# Patient Record
Sex: Male | Born: 1965 | Race: White | Hispanic: No | Marital: Married | Smoking: Former smoker
Health system: Southern US, Community
[De-identification: ages and names within clinical notes are randomized; demographics above are authoritative.]

## PROBLEM LIST (undated history)

## (undated) DIAGNOSIS — S42002A Fracture of unspecified part of left clavicle, initial encounter for closed fracture: Secondary | ICD-10-CM

## (undated) HISTORY — PX: ESOPHAGOGASTRODUODENOSCOPY: SHX1529

---

## 2014-07-17 ENCOUNTER — Encounter (HOSPITAL_COMMUNITY): Payer: Self-pay | Admitting: *Deleted

## 2014-07-17 NOTE — Progress Notes (Signed)
Pt denies SOB, chest pain, and being under the care of a cardiologist. Pt denies having a chest x ray and EKG within the last year. Pt denies having a stress test, echo and cardiac cath. Pt made aware to stop  taking Aspirin, otc vitamins and herbal medications. Do not take any NSAIDs ie: Ibuprofen, Advil, Naproxen or any medication containing Aspirin. Pt verbalized understanding of all pre-op instructions. 

## 2014-07-18 ENCOUNTER — Encounter (HOSPITAL_COMMUNITY): Payer: Self-pay | Admitting: Surgery

## 2014-07-18 ENCOUNTER — Ambulatory Visit (HOSPITAL_COMMUNITY): Payer: BLUE CROSS/BLUE SHIELD | Admitting: Certified Registered Nurse Anesthetist

## 2014-07-18 ENCOUNTER — Ambulatory Visit (HOSPITAL_COMMUNITY)
Admission: RE | Admit: 2014-07-18 | Discharge: 2014-07-18 | Disposition: A | Discharge status: Home / Self Care | Payer: BLUE CROSS/BLUE SHIELD | Source: Ambulatory Visit | Attending: Orthopedic Surgery | Admitting: Orthopedic Surgery

## 2014-07-18 ENCOUNTER — Ambulatory Visit (HOSPITAL_COMMUNITY): Payer: BLUE CROSS/BLUE SHIELD

## 2014-07-18 ENCOUNTER — Encounter (HOSPITAL_COMMUNITY)
Admission: RE | Disposition: A | Discharge status: Home / Self Care | Payer: Self-pay | Source: Ambulatory Visit | Attending: Orthopedic Surgery

## 2014-07-18 DIAGNOSIS — Z87891 Personal history of nicotine dependence: Secondary | ICD-10-CM | POA: Insufficient documentation

## 2014-07-18 DIAGNOSIS — X58XXXA Exposure to other specified factors, initial encounter: Secondary | ICD-10-CM | POA: Diagnosis not present

## 2014-07-18 DIAGNOSIS — Y9355 Activity, bike riding: Secondary | ICD-10-CM | POA: Insufficient documentation

## 2014-07-18 DIAGNOSIS — S42022A Displaced fracture of shaft of left clavicle, initial encounter for closed fracture: Secondary | ICD-10-CM | POA: Insufficient documentation

## 2014-07-18 DIAGNOSIS — Z419 Encounter for procedure for purposes other than remedying health state, unspecified: Secondary | ICD-10-CM

## 2014-07-18 HISTORY — DX: Fracture of unspecified part of left clavicle, initial encounter for closed fracture: S42.002A

## 2014-07-18 HISTORY — PX: ORIF CLAVICULAR FRACTURE: SHX5055

## 2014-07-18 LAB — CBC WITH DIFFERENTIAL/PLATELET
Basophils Absolute: 0 10*3/uL (ref 0.0–0.1)
Basophils Relative: 0 % (ref 0–1)
EOS PCT: 2 % (ref 0–5)
Eosinophils Absolute: 0.1 10*3/uL (ref 0.0–0.7)
HCT: 37.1 % — ABNORMAL LOW (ref 39.0–52.0)
HEMOGLOBIN: 12.9 g/dL — AB (ref 13.0–17.0)
Lymphocytes Relative: 23 % (ref 12–46)
Lymphs Abs: 1.6 10*3/uL (ref 0.7–4.0)
MCH: 31.8 pg (ref 26.0–34.0)
MCHC: 34.8 g/dL (ref 30.0–36.0)
MCV: 91.4 fL (ref 78.0–100.0)
MONO ABS: 0.6 10*3/uL (ref 0.1–1.0)
MONOS PCT: 9 % (ref 3–12)
NEUTROS ABS: 4.6 10*3/uL (ref 1.7–7.7)
Neutrophils Relative %: 66 % (ref 43–77)
PLATELETS: 224 10*3/uL (ref 150–400)
RBC: 4.06 MIL/uL — ABNORMAL LOW (ref 4.22–5.81)
RDW: 12.1 % (ref 11.5–15.5)
WBC: 7 10*3/uL (ref 4.0–10.5)

## 2014-07-18 LAB — COMPREHENSIVE METABOLIC PANEL
ALK PHOS: 52 U/L (ref 38–126)
ALT: 19 U/L (ref 17–63)
AST: 33 U/L (ref 15–41)
Albumin: 3.9 g/dL (ref 3.5–5.0)
Anion gap: 8 (ref 5–15)
BUN: 16 mg/dL (ref 6–20)
CALCIUM: 9.4 mg/dL (ref 8.9–10.3)
CO2: 27 mmol/L (ref 22–32)
Chloride: 104 mmol/L (ref 101–111)
Creatinine, Ser: 0.76 mg/dL (ref 0.61–1.24)
GLUCOSE: 118 mg/dL — AB (ref 65–99)
POTASSIUM: 4.5 mmol/L (ref 3.5–5.1)
SODIUM: 139 mmol/L (ref 135–145)
Total Bilirubin: 1.6 mg/dL — ABNORMAL HIGH (ref 0.3–1.2)
Total Protein: 6.6 g/dL (ref 6.5–8.1)

## 2014-07-18 LAB — APTT: aPTT: 29 seconds (ref 24–37)

## 2014-07-18 LAB — PROTIME-INR
INR: 1.05 (ref 0.00–1.49)
Prothrombin Time: 13.9 seconds (ref 11.6–15.2)

## 2014-07-18 SURGERY — OPEN REDUCTION INTERNAL FIXATION (ORIF) CLAVICULAR FRACTURE
Anesthesia: Regional | Site: Shoulder | Laterality: Left

## 2014-07-18 MED ORDER — GLYCOPYRROLATE 0.2 MG/ML IJ SOLN
INTRAMUSCULAR | Status: AC
  Filled 2014-07-18: qty 2

## 2014-07-18 MED ORDER — CHLORHEXIDINE GLUCONATE 4 % EX LIQD: 60.0000 mL | Freq: Once | CUTANEOUS | Status: DC

## 2014-07-18 MED ORDER — MIDAZOLAM HCL 5 MG/5ML IJ SOLN
INTRAMUSCULAR | Status: DC | PRN
  Administered 2014-07-18: 2 mg via INTRAVENOUS

## 2014-07-18 MED ORDER — PROPOFOL 10 MG/ML IV BOLUS
INTRAVENOUS | Status: DC | PRN
  Administered 2014-07-18: 200 mg via INTRAVENOUS

## 2014-07-18 MED ORDER — CEFAZOLIN SODIUM-DEXTROSE 2-3 GM-% IV SOLR
INTRAVENOUS | Status: AC
  Filled 2014-07-18: qty 50

## 2014-07-18 MED ORDER — FENTANYL CITRATE (PF) 100 MCG/2ML IJ SOLN
INTRAMUSCULAR | Status: DC | PRN
  Administered 2014-07-18: 150 ug via INTRAVENOUS

## 2014-07-18 MED ORDER — 0.9 % SODIUM CHLORIDE (POUR BTL) OPTIME
TOPICAL | Status: DC | PRN
  Administered 2014-07-18: 1000 mL

## 2014-07-18 MED ORDER — MIDAZOLAM HCL 2 MG/2ML IJ SOLN
INTRAMUSCULAR | Status: AC
  Administered 2014-07-18: 2 mg
  Filled 2014-07-18: qty 2

## 2014-07-18 MED ORDER — MIDAZOLAM HCL 2 MG/2ML IJ SOLN
INTRAMUSCULAR | Status: AC
  Filled 2014-07-18: qty 2

## 2014-07-18 MED ORDER — ONDANSETRON HCL 4 MG/2ML IJ SOLN
INTRAMUSCULAR | Status: DC | PRN
  Administered 2014-07-18: 4 mg via INTRAVENOUS

## 2014-07-18 MED ORDER — LACTATED RINGERS IV SOLN: INTRAVENOUS | Status: DC

## 2014-07-18 MED ORDER — GLYCOPYRROLATE 0.2 MG/ML IJ SOLN
INTRAMUSCULAR | Status: DC | PRN
  Administered 2014-07-18: 0.4 mg via INTRAVENOUS

## 2014-07-18 MED ORDER — OXYCODONE-ACETAMINOPHEN 5-325 MG PO TABS: 1.0000 | ORAL_TABLET | ORAL | Status: DC | PRN

## 2014-07-18 MED ORDER — ROCURONIUM BROMIDE 100 MG/10ML IV SOLN
INTRAVENOUS | Status: DC | PRN
  Administered 2014-07-18: 40 mg via INTRAVENOUS

## 2014-07-18 MED ORDER — FENTANYL CITRATE (PF) 250 MCG/5ML IJ SOLN
INTRAMUSCULAR | Status: AC
  Filled 2014-07-18: qty 5

## 2014-07-18 MED ORDER — ROCURONIUM BROMIDE 50 MG/5ML IV SOLN
INTRAVENOUS | Status: AC
  Filled 2014-07-18: qty 1

## 2014-07-18 MED ORDER — LACTATED RINGERS IV SOLN
INTRAVENOUS | Status: DC
  Administered 2014-07-18 (×2): via INTRAVENOUS

## 2014-07-18 MED ORDER — PROMETHAZINE HCL 25 MG/ML IJ SOLN
6.2500 mg | INTRAMUSCULAR | Status: DC | PRN
Start: 2014-07-18 — End: 2014-07-18

## 2014-07-18 MED ORDER — NEOSTIGMINE METHYLSULFATE 10 MG/10ML IV SOLN
INTRAVENOUS | Status: DC | PRN
  Administered 2014-07-18: 3 mg via INTRAVENOUS

## 2014-07-18 MED ORDER — LIDOCAINE HCL (CARDIAC) 20 MG/ML IV SOLN
INTRAVENOUS | Status: DC | PRN
  Administered 2014-07-18: 60 mg via INTRAVENOUS

## 2014-07-18 MED ORDER — SUCCINYLCHOLINE CHLORIDE 20 MG/ML IJ SOLN
INTRAMUSCULAR | Status: AC
  Filled 2014-07-18: qty 1

## 2014-07-18 MED ORDER — PROPOFOL 10 MG/ML IV BOLUS
INTRAVENOUS | Status: AC
  Filled 2014-07-18: qty 20

## 2014-07-18 MED ORDER — DIAZEPAM 5 MG PO TABS: 2.5000 mg | ORAL_TABLET | Freq: Four times a day (QID) | ORAL | Status: DC | PRN

## 2014-07-18 MED ORDER — NEOSTIGMINE METHYLSULFATE 10 MG/10ML IV SOLN
INTRAVENOUS | Status: AC
  Filled 2014-07-18: qty 1

## 2014-07-18 MED ORDER — CEFAZOLIN SODIUM-DEXTROSE 2-3 GM-% IV SOLR
2.0000 g | INTRAVENOUS | Status: AC
  Administered 2014-07-18: 2 g via INTRAVENOUS

## 2014-07-18 MED ORDER — ONDANSETRON HCL 4 MG/2ML IJ SOLN
INTRAMUSCULAR | Status: AC
  Filled 2014-07-18: qty 2

## 2014-07-18 MED ORDER — ARTIFICIAL TEARS OP OINT
TOPICAL_OINTMENT | OPHTHALMIC | Status: AC
  Filled 2014-07-18: qty 3.5

## 2014-07-18 MED ORDER — HYDROMORPHONE HCL 1 MG/ML IJ SOLN: 0.2500 mg | INTRAMUSCULAR | Status: DC | PRN

## 2014-07-18 MED ORDER — FENTANYL CITRATE (PF) 100 MCG/2ML IJ SOLN
INTRAMUSCULAR | Status: AC
  Administered 2014-07-18: 50 ug
  Filled 2014-07-18: qty 2

## 2014-07-18 SURGICAL SUPPLY — 62 items
BIT DRILL 2.3 QUICK RELEASE (BIT) ×1 IMPLANT
BIT DRILL QUICK RELEASE 2.0MM (INSTRUMENTS) ×1 IMPLANT
CLOSURE WOUND 1/2 X4 (GAUZE/BANDAGES/DRESSINGS) ×1
COVER SURGICAL LIGHT HANDLE (MISCELLANEOUS) ×3 IMPLANT
DERMABOND ADVANCED (GAUZE/BANDAGES/DRESSINGS) ×2
DERMABOND ADVANCED .7 DNX12 (GAUZE/BANDAGES/DRESSINGS) ×1 IMPLANT
DRAPE C-ARM 42X72 X-RAY (DRAPES) ×3 IMPLANT
DRAPE IMP U-DRAPE 54X76 (DRAPES) ×3 IMPLANT
DRAPE INCISE IOBAN 66X45 STRL (DRAPES) ×3 IMPLANT
DRAPE ORTHO SPLIT 77X108 STRL (DRAPES) ×2
DRAPE SURG 17X23 STRL (DRAPES) ×3 IMPLANT
DRAPE SURG ORHT 6 SPLT 77X108 (DRAPES) ×1 IMPLANT
DRAPE U-SHAPE 47X51 STRL (DRAPES) ×6 IMPLANT
DRILL 2.3 QUICK RELEASE (BIT) ×3
DRILL QUICK RELEASE 2.0MM (INSTRUMENTS) ×3
DRSG EMULSION OIL 3X3 NADH (GAUZE/BANDAGES/DRESSINGS) ×3 IMPLANT
DRSG MEPILEX BORDER 4X8 (GAUZE/BANDAGES/DRESSINGS) ×3 IMPLANT
ELECT REM PT RETURN 9FT ADLT (ELECTROSURGICAL) ×3
ELECTRODE REM PT RTRN 9FT ADLT (ELECTROSURGICAL) ×1 IMPLANT
GAUZE SPONGE 4X4 12PLY STRL (GAUZE/BANDAGES/DRESSINGS) IMPLANT
GLOVE BIO SURGEON STRL SZ7.5 (GLOVE) ×3 IMPLANT
GLOVE BIO SURGEON STRL SZ8 (GLOVE) ×3 IMPLANT
GLOVE EUDERMIC 7 POWDERFREE (GLOVE) ×3 IMPLANT
GLOVE SS BIOGEL STRL SZ 7.5 (GLOVE) ×1 IMPLANT
GLOVE SUPERSENSE BIOGEL SZ 7.5 (GLOVE) ×2
GOWN STRL REUS W/ TWL LRG LVL3 (GOWN DISPOSABLE) ×1 IMPLANT
GOWN STRL REUS W/ TWL XL LVL3 (GOWN DISPOSABLE) ×2 IMPLANT
GOWN STRL REUS W/TWL LRG LVL3 (GOWN DISPOSABLE) ×2
GOWN STRL REUS W/TWL XL LVL3 (GOWN DISPOSABLE) ×4
KIT BASIN OR (CUSTOM PROCEDURE TRAY) ×3 IMPLANT
KIT ROOM TURNOVER OR (KITS) ×3 IMPLANT
MANIFOLD NEPTUNE II (INSTRUMENTS) ×3 IMPLANT
NEEDLE HYPO 25GX1X1/2 BEV (NEEDLE) ×3 IMPLANT
NS IRRIG 1000ML POUR BTL (IV SOLUTION) ×3 IMPLANT
PACK SHOULDER (CUSTOM PROCEDURE TRAY) ×3 IMPLANT
PACK UNIVERSAL I (CUSTOM PROCEDURE TRAY) ×3 IMPLANT
PAD ARMBOARD 7.5X6 YLW CONV (MISCELLANEOUS) ×6 IMPLANT
PLATE LOCKING 8H LEFT (Plate) ×3 IMPLANT
SCREW CORTICAL 2.3X14 (Screw) ×3 IMPLANT
SCREW HEXALOBE LOCKING 3.5X16M (Screw) ×3 IMPLANT
SCREW HEXALOBE NON-LOCK 3.5X14 (Screw) ×6 IMPLANT
SCREW LOCK 12X3.5X HEXALOBE (Screw) ×1 IMPLANT
SCREW LOCKING 3.5X12 (Screw) ×2 IMPLANT
SCREW NON TOGG 2.3X20MM (Screw) ×3 IMPLANT
SCREW NONLOCK HEX 3.5X12 (Screw) ×6 IMPLANT
SLING ARM FOAM STRAP LRG (SOFTGOODS) ×3 IMPLANT
SPONGE LAP 18X18 X RAY DECT (DISPOSABLE) ×6 IMPLANT
SPONGE LAP 4X18 X RAY DECT (DISPOSABLE) ×6 IMPLANT
STAPLER VISISTAT 35W (STAPLE) ×3 IMPLANT
STRIP CLOSURE SKIN 1/2X4 (GAUZE/BANDAGES/DRESSINGS) ×2 IMPLANT
SUCTION FRAZIER TIP 10 FR DISP (SUCTIONS) ×3 IMPLANT
SUT MNCRL AB 3-0 PS2 18 (SUTURE) ×3 IMPLANT
SUT VIC AB 1 CT1 27 (SUTURE) ×4
SUT VIC AB 1 CT1 27XBRD ANBCTR (SUTURE) ×2 IMPLANT
SUT VIC AB 2-0 CT1 27 (SUTURE) ×2
SUT VIC AB 2-0 CT1 TAPERPNT 27 (SUTURE) ×1 IMPLANT
SUT VICRYL 0 CT 1 36IN (SUTURE) ×3 IMPLANT
SYR CONTROL 10ML LL (SYRINGE) ×3 IMPLANT
TOWEL OR 17X24 6PK STRL BLUE (TOWEL DISPOSABLE) ×3 IMPLANT
TOWEL OR 17X26 10 PK STRL BLUE (TOWEL DISPOSABLE) ×3 IMPLANT
WATER STERILE IRR 1000ML POUR (IV SOLUTION) IMPLANT
YANKAUER SUCT BULB TIP NO VENT (SUCTIONS) ×3 IMPLANT

## 2014-07-18 NOTE — Anesthesia Procedure Notes (Addendum)
Procedure Name: Intubation Date/Time: 07/18/2014 2:23 PM Performed by: [place] Pink Pre-anesthesia Checklist: Patient identified, Emergency Drugs available, Suction available, Patient being monitored and Timeout performed Patient Re-evaluated:Patient Re-evaluated prior to inductionOxygen Delivery Method: Circle system utilized Preoxygenation: Pre-oxygenation with 100% oxygen Intubation Type: IV induction Ventilation: Mask ventilation without difficulty Laryngoscope Size: Mac and 3 Grade View: Grade I Tube type: Oral Tube size: 7.5 mm Number of attempts: 1 Airway Equipment and Method: Stylet and LTA kit utilized Placement Confirmation: ETT inserted through vocal cords under direct vision,  positive ETCO2 and breath sounds checked- equal and bilateral Secured at: 21 cm Tube secured with: Tape Dental Injury: Teeth and Oropharynx as per pre-operative assessment    Anesthesia Regional Block:  Supraclavicular block  Pre-Anesthetic Checklist: ,, timeout performed, Correct Patient, Correct Site, Correct Laterality, Correct Procedure, Correct Position, site marked, Risks and benefits discussed,  Surgical consent,  Pre-op evaluation,  At surgeon's request and post-op pain management  Laterality: Left  Prep: chloraprep       Needles:   Needle Type: Stimulator Needle - 40          Additional Needles:  Procedures: Doppler guided, ultrasound guided (picture in chart) and nerve stimulator Supraclavicular block  Nerve Stimulator or Paresthesia:  Response: 0.5 mA,   Additional Responses:   Narrative:  Start time: 07/18/2014 1:45 PM End time: 07/18/2014 2:00 PM Injection made incrementally with aspirations every 5 mL.  Performed by: Personally

## 2014-07-18 NOTE — Anesthesia Postprocedure Evaluation (Signed)
  Anesthesia Post-op Note  Patient: Randall Villarreal  Procedure(s) Performed: Procedure(s): OPEN REDUCTION INTERNAL FIXATION (ORIF) LEFT CLAVICLE  FRACTURE (Left)  Patient Location: PACU  Anesthesia Type:General  Level of Consciousness: awake  Airway and Oxygen Therapy: Patient Spontanous Breathing  Post-op Pain: mild  Post-op Assessment: Post-op Vital signs reviewed              Post-op Vital Signs: Reviewed  Last Vitals:  Filed Vitals:   07/18/14 1410  BP: 117/67  Pulse: 74  Temp:   Resp: 19    Complications: No apparent anesthesia complications

## 2014-07-18 NOTE — Op Note (Signed)
07/18/2014  4:06 PM  PATIENT:   Randall Villarreal  49 y.o. male  PRE-OPERATIVE DIAGNOSIS:  COMMINUTED, DISPLACED LEFT CLAVICLE FRACTURE   POST-OPERATIVE DIAGNOSIS:  SAME  PROCEDURE:  ORIF  SURGEON:  Roshonda Sperl, Vania Rea. M.D.  ASSISTANTS: Shuford pac   ANESTHESIA:   GET + ISB  EBL: 50cc  SPECIMEN:  none  Drains: none   PATIENT DISPOSITION:  PACU - hemodynamically stable.    PLAN OF CARE: Discharge to home after PACU  Dictation# ??????   Contact # [phone]

## 2014-07-18 NOTE — Addendum Note (Signed)
Addendum  created 07/18/14 1704 by Daiva Eves, CRNA   Modules edited: Anesthesia Attestations, Anesthesia Responsible Staff

## 2014-07-18 NOTE — Transfer of Care (Signed)
Immediate Anesthesia Transfer of Care Note  Patient: Randall Villarreal  Procedure(s) Performed: Procedure(s): OPEN REDUCTION INTERNAL FIXATION (ORIF) LEFT CLAVICLE  FRACTURE (Left)  Patient Location: PACU  Anesthesia Type:General  Level of Consciousness: sedated  Airway & Oxygen Therapy: Patient Spontanous Breathing and Patient connected to nasal cannula oxygen  Post-op Assessment: Report given to RN and Post -op Vital signs reviewed and stable  Post vital signs: Reviewed and stable  Last Vitals:  Filed Vitals:   07/18/14 1410  BP: 117/67  Pulse: 74  Temp:   Resp: 19    Complications: No apparent anesthesia complications   SPO2 100%. Pt comfortable. All questions answered.
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
## 2014-07-18 NOTE — Anesthesia Preprocedure Evaluation (Addendum)
Anesthesia Evaluation  Patient identified by MRN, date of birth, ID band Patient awake    Reviewed: Allergy & Precautions, NPO status   Airway Mallampati: II  TM Distance: >3 FB Neck ROM: Full    Dental   Pulmonary former smoker,  breath sounds clear to auscultation        Cardiovascular negative cardio ROS  Rhythm:Regular Rate:Normal     Neuro/Psych    GI/Hepatic negative GI ROS, Neg liver ROS,   Endo/Other  negative endocrine ROS  Renal/GU negative Renal ROS     Musculoskeletal   Abdominal   Peds  Hematology   Anesthesia Other Findings   Reproductive/Obstetrics                            Anesthesia Physical Anesthesia Plan  ASA: I  Anesthesia Plan: General and Regional   Post-op Pain Management:    Induction: Intravenous  Airway Management Planned: Oral ETT  Additional Equipment:   Intra-op Plan:   Post-operative Plan: Extubation in OR  Informed Consent: I have reviewed the patients History and Physical, chart, labs and discussed the procedure including the risks, benefits and alternatives for the proposed anesthesia with the patient or authorized representative who has indicated his/her understanding and acceptance.   Dental advisory given  Plan Discussed with: CRNA, Anesthesiologist and Surgeon  Anesthesia Plan Comments:        Anesthesia Quick Evaluation

## 2014-07-18 NOTE — Progress Notes (Signed)
Elease Hashimoto, CRNA at bedside.

## 2014-07-18 NOTE — Discharge Instructions (Signed)
You may change dressing in 3 days. You may shower at that time and replace with a clean dry dressing. You may allow your arm to dangle and move elbow wrist and hand. Sling to be worn except for hygiene and to occasionally let arm dangle by side.

## 2014-07-19 ENCOUNTER — Encounter (HOSPITAL_COMMUNITY): Payer: Self-pay | Admitting: Orthopedic Surgery
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
## 2015-03-24 ENCOUNTER — Encounter (HOSPITAL_COMMUNITY): Payer: Self-pay | Admitting: *Deleted

## 2015-03-24 NOTE — Progress Notes (Signed)
Pt denies cardiac history, chest pain or sob. 

## 2015-03-25 ENCOUNTER — Encounter (HOSPITAL_COMMUNITY): Payer: Self-pay | Admitting: Anesthesiology

## 2015-03-25 ENCOUNTER — Ambulatory Visit (HOSPITAL_COMMUNITY): Payer: BLUE CROSS/BLUE SHIELD | Admitting: Anesthesiology

## 2015-03-25 ENCOUNTER — Ambulatory Visit (HOSPITAL_COMMUNITY)
Admission: RE | Admit: 2015-03-25 | Discharge: 2015-03-25 | Disposition: A | Discharge status: Home / Self Care | Payer: BLUE CROSS/BLUE SHIELD | Source: Ambulatory Visit | Attending: Orthopedic Surgery | Admitting: Orthopedic Surgery

## 2015-03-25 ENCOUNTER — Ambulatory Visit (HOSPITAL_COMMUNITY): Payer: BLUE CROSS/BLUE SHIELD

## 2015-03-25 ENCOUNTER — Encounter (HOSPITAL_COMMUNITY)
Admission: RE | Disposition: A | Discharge status: Home / Self Care | Payer: Self-pay | Source: Ambulatory Visit | Attending: Orthopedic Surgery

## 2015-03-25 DIAGNOSIS — Z419 Encounter for procedure for purposes other than remedying health state, unspecified: Secondary | ICD-10-CM

## 2015-03-25 DIAGNOSIS — S42021A Displaced fracture of shaft of right clavicle, initial encounter for closed fracture: Secondary | ICD-10-CM | POA: Insufficient documentation

## 2015-03-25 DIAGNOSIS — Z87891 Personal history of nicotine dependence: Secondary | ICD-10-CM | POA: Insufficient documentation

## 2015-03-25 DIAGNOSIS — S42001A Fracture of unspecified part of right clavicle, initial encounter for closed fracture: Secondary | ICD-10-CM | POA: Diagnosis present

## 2015-03-25 HISTORY — PX: ORIF CLAVICULAR FRACTURE: SHX5055

## 2015-03-25 LAB — CBC WITH DIFFERENTIAL/PLATELET
BASOS ABS: 0 10*3/uL (ref 0.0–0.1)
Basophils Relative: 0 %
EOS ABS: 0.3 10*3/uL (ref 0.0–0.7)
EOS PCT: 4 %
HCT: 40 % (ref 39.0–52.0)
HEMOGLOBIN: 13.3 g/dL (ref 13.0–17.0)
LYMPHS ABS: 1.9 10*3/uL (ref 0.7–4.0)
Lymphocytes Relative: 29 %
MCH: 31.4 pg (ref 26.0–34.0)
MCHC: 33.3 g/dL (ref 30.0–36.0)
MCV: 94.3 fL (ref 78.0–100.0)
Monocytes Absolute: 0.5 10*3/uL (ref 0.1–1.0)
Monocytes Relative: 7 %
NEUTROS PCT: 60 %
Neutro Abs: 4.1 10*3/uL (ref 1.7–7.7)
PLATELETS: 220 10*3/uL (ref 150–400)
RBC: 4.24 MIL/uL (ref 4.22–5.81)
RDW: 12.2 % (ref 11.5–15.5)
WBC: 6.7 10*3/uL (ref 4.0–10.5)

## 2015-03-25 LAB — COMPREHENSIVE METABOLIC PANEL
ALK PHOS: 55 U/L (ref 38–126)
ALT: 19 U/L (ref 17–63)
AST: 26 U/L (ref 15–41)
Albumin: 4 g/dL (ref 3.5–5.0)
Anion gap: 9 (ref 5–15)
BUN: 17 mg/dL (ref 6–20)
CALCIUM: 9.5 mg/dL (ref 8.9–10.3)
CHLORIDE: 104 mmol/L (ref 101–111)
CO2: 27 mmol/L (ref 22–32)
CREATININE: 0.81 mg/dL (ref 0.61–1.24)
GFR calc non Af Amer: >60 mL/min (ref >60)
Glucose, Bld: 106 mg/dL — ABNORMAL HIGH (ref 65–99)
Potassium: 4.4 mmol/L (ref 3.5–5.1)
SODIUM: 140 mmol/L (ref 135–145)
Total Bilirubin: 1.2 mg/dL (ref 0.3–1.2)
Total Protein: 7.1 g/dL (ref 6.5–8.1)

## 2015-03-25 LAB — PROTIME-INR
INR: 1.1 (ref 0.00–1.49)
Prothrombin Time: 14.4 seconds (ref 11.6–15.2)

## 2015-03-25 LAB — APTT: aPTT: 30 seconds (ref 24–37)

## 2015-03-25 SURGERY — OPEN REDUCTION INTERNAL FIXATION (ORIF) CLAVICULAR FRACTURE
Anesthesia: General | Site: Shoulder | Laterality: Right

## 2015-03-25 MED ORDER — MIDAZOLAM HCL 2 MG/2ML IJ SOLN
INTRAMUSCULAR | Status: AC
Start: 2015-03-25 — End: 2015-03-25
  Administered 2015-03-25: 2 mg
  Filled 2015-03-25: qty 2

## 2015-03-25 MED ORDER — PROPOFOL 10 MG/ML IV BOLUS
INTRAVENOUS | Status: DC | PRN
  Administered 2015-03-25: 200 mg via INTRAVENOUS

## 2015-03-25 MED ORDER — FENTANYL CITRATE (PF) 250 MCG/5ML IJ SOLN
INTRAMUSCULAR | Status: AC
  Filled 2015-03-25: qty 5

## 2015-03-25 MED ORDER — OXYCODONE-ACETAMINOPHEN 5-325 MG PO TABS: 1.0000 | ORAL_TABLET | ORAL | Status: AC | PRN

## 2015-03-25 MED ORDER — ONDANSETRON HCL 4 MG/2ML IJ SOLN
INTRAMUSCULAR | Status: DC | PRN
  Administered 2015-03-25: 4 mg via INTRAVENOUS

## 2015-03-25 MED ORDER — GLYCOPYRROLATE 0.2 MG/ML IJ SOLN
INTRAMUSCULAR | Status: DC | PRN
  Administered 2015-03-25: 0.6 mg via INTRAVENOUS

## 2015-03-25 MED ORDER — LIDOCAINE HCL (CARDIAC) 20 MG/ML IV SOLN
INTRAVENOUS | Status: DC | PRN
  Administered 2015-03-25: 100 mg via INTRAVENOUS

## 2015-03-25 MED ORDER — FENTANYL CITRATE (PF) 100 MCG/2ML IJ SOLN
INTRAMUSCULAR | Status: AC
  Administered 2015-03-25: 100 ug
  Filled 2015-03-25: qty 2

## 2015-03-25 MED ORDER — ONDANSETRON HCL 4 MG/2ML IJ SOLN: 4.0000 mg | Freq: Once | INTRAMUSCULAR | Status: DC | PRN

## 2015-03-25 MED ORDER — FENTANYL CITRATE (PF) 100 MCG/2ML IJ SOLN
INTRAMUSCULAR | Status: DC | PRN
  Administered 2015-03-25: 100 ug via INTRAVENOUS

## 2015-03-25 MED ORDER — LIDOCAINE HCL (CARDIAC) 20 MG/ML IV SOLN
INTRAVENOUS | Status: AC
  Filled 2015-03-25: qty 5

## 2015-03-25 MED ORDER — NEOSTIGMINE METHYLSULFATE 10 MG/10ML IV SOLN
INTRAVENOUS | Status: DC | PRN
  Administered 2015-03-25: 4 mg via INTRAVENOUS

## 2015-03-25 MED ORDER — MEPERIDINE HCL 25 MG/ML IJ SOLN: 6.2500 mg | INTRAMUSCULAR | Status: DC | PRN

## 2015-03-25 MED ORDER — PROPOFOL 10 MG/ML IV BOLUS
INTRAVENOUS | Status: AC
  Filled 2015-03-25: qty 20

## 2015-03-25 MED ORDER — BUPIVACAINE-EPINEPHRINE (PF) 0.5% -1:200000 IJ SOLN
INTRAMUSCULAR | Status: DC | PRN
  Administered 2015-03-25: 30 mL via PERINEURAL

## 2015-03-25 MED ORDER — CHLORHEXIDINE GLUCONATE 4 % EX LIQD: 60.0000 mL | Freq: Once | CUTANEOUS | Status: DC

## 2015-03-25 MED ORDER — LACTATED RINGERS IV SOLN
INTRAVENOUS | Status: DC
  Administered 2015-03-25: 10 mL/h via INTRAVENOUS
  Administered 2015-03-25: 15:00:00 via INTRAVENOUS

## 2015-03-25 MED ORDER — HYDROMORPHONE HCL 1 MG/ML IJ SOLN: 0.2500 mg | INTRAMUSCULAR | Status: DC | PRN

## 2015-03-25 MED ORDER — 0.9 % SODIUM CHLORIDE (POUR BTL) OPTIME
TOPICAL | Status: DC | PRN
  Administered 2015-03-25: 1000 mL

## 2015-03-25 MED ORDER — ONDANSETRON HCL 4 MG/2ML IJ SOLN
INTRAMUSCULAR | Status: AC
  Filled 2015-03-25: qty 2

## 2015-03-25 MED ORDER — DIAZEPAM 5 MG PO TABS: 2.5000 mg | ORAL_TABLET | Freq: Four times a day (QID) | ORAL | Status: AC | PRN

## 2015-03-25 MED ORDER — MIDAZOLAM HCL 2 MG/2ML IJ SOLN
INTRAMUSCULAR | Status: AC
  Filled 2015-03-25: qty 2

## 2015-03-25 MED ORDER — CEFAZOLIN SODIUM-DEXTROSE 2-3 GM-% IV SOLR
2.0000 g | INTRAVENOUS | Status: AC
  Administered 2015-03-25: 2 g via INTRAVENOUS
  Filled 2015-03-25: qty 50

## 2015-03-25 MED ORDER — GLYCOPYRROLATE 0.2 MG/ML IJ SOLN
INTRAMUSCULAR | Status: AC
  Filled 2015-03-25: qty 3

## 2015-03-25 MED ORDER — ROCURONIUM BROMIDE 100 MG/10ML IV SOLN
INTRAVENOUS | Status: DC | PRN
  Administered 2015-03-25: 50 mg via INTRAVENOUS

## 2015-03-25 MED ORDER — ROCURONIUM BROMIDE 50 MG/5ML IV SOLN
INTRAVENOUS | Status: AC
  Filled 2015-03-25: qty 1

## 2015-03-25 SURGICAL SUPPLY — 60 items
BIT DRILL 2.8X5 QR DISP (BIT) ×3 IMPLANT
BIT DRILL QUICK RELEASE 3.5MM (BIT) ×1 IMPLANT
CLOSURE WOUND 1/2 X4 (GAUZE/BANDAGES/DRESSINGS) ×1
COVER SURGICAL LIGHT HANDLE (MISCELLANEOUS) ×3 IMPLANT
DERMABOND ADVANCED (GAUZE/BANDAGES/DRESSINGS) ×2
DERMABOND ADVANCED .7 DNX12 (GAUZE/BANDAGES/DRESSINGS) ×1 IMPLANT
DRAPE C-ARM 42X72 X-RAY (DRAPES) ×3 IMPLANT
DRAPE IMP U-DRAPE 54X76 (DRAPES) ×3 IMPLANT
DRAPE INCISE IOBAN 66X45 STRL (DRAPES) ×3 IMPLANT
DRAPE ORTHO SPLIT 77X108 STRL (DRAPES) ×2
DRAPE SURG 17X23 STRL (DRAPES) ×3 IMPLANT
DRAPE SURG ORHT 6 SPLT 77X108 (DRAPES) ×1 IMPLANT
DRAPE U-SHAPE 47X51 STRL (DRAPES) ×6 IMPLANT
DRILL QUICK RELEASE 3.5MM (BIT) ×3
DRSG EMULSION OIL 3X3 NADH (GAUZE/BANDAGES/DRESSINGS) ×3 IMPLANT
DRSG MEPILEX BORDER 4X8 (GAUZE/BANDAGES/DRESSINGS) ×3 IMPLANT
ELECT REM PT RETURN 9FT ADLT (ELECTROSURGICAL) ×3
ELECTRODE REM PT RTRN 9FT ADLT (ELECTROSURGICAL) ×1 IMPLANT
GAUZE SPONGE 4X4 12PLY STRL (GAUZE/BANDAGES/DRESSINGS) ×3 IMPLANT
GLOVE BIO SURGEON STRL SZ7.5 (GLOVE) ×3 IMPLANT
GLOVE BIO SURGEON STRL SZ8 (GLOVE) ×3 IMPLANT
GLOVE EUDERMIC 7 POWDERFREE (GLOVE) ×3 IMPLANT
GLOVE SS BIOGEL STRL SZ 7.5 (GLOVE) ×1 IMPLANT
GLOVE SUPERSENSE BIOGEL SZ 7.5 (GLOVE) ×2
GOWN STRL REUS W/ TWL LRG LVL3 (GOWN DISPOSABLE) ×1 IMPLANT
GOWN STRL REUS W/ TWL XL LVL3 (GOWN DISPOSABLE) ×2 IMPLANT
GOWN STRL REUS W/TWL LRG LVL3 (GOWN DISPOSABLE) ×2
GOWN STRL REUS W/TWL XL LVL3 (GOWN DISPOSABLE) ×4
KIT BASIN OR (CUSTOM PROCEDURE TRAY) ×3 IMPLANT
KIT ROOM TURNOVER OR (KITS) ×3 IMPLANT
MANIFOLD NEPTUNE II (INSTRUMENTS) ×3 IMPLANT
NEEDLE HYPO 25GX1X1/2 BEV (NEEDLE) ×3 IMPLANT
NS IRRIG 1000ML POUR BTL (IV SOLUTION) ×3 IMPLANT
PACK SHOULDER (CUSTOM PROCEDURE TRAY) ×3 IMPLANT
PACK UNIVERSAL I (CUSTOM PROCEDURE TRAY) IMPLANT
PAD ARMBOARD 7.5X6 YLW CONV (MISCELLANEOUS) ×6 IMPLANT
PLATE ANTERIOR HOLE (Plate) ×3 IMPLANT
SCREW CORTICAL 3.5X20MM (Screw) ×12 IMPLANT
SCREW HEXALOBE LOCKING 3.5X14M (Screw) ×3 IMPLANT
SCREW HEXALOBE LOCKING 3.5X16M (Screw) ×3 IMPLANT
SCREW HEXALOBE NON-LOCK 3.5X16 (Screw) ×3 IMPLANT
SCREW LOCK 24X3.5X HEXALOBE (Screw) ×1 IMPLANT
SCREW LOCKING 3.5X24 (Screw) ×2 IMPLANT
SLING ARM FOAM STRAP LRG (SOFTGOODS) ×3 IMPLANT
SPONGE LAP 18X18 X RAY DECT (DISPOSABLE) ×6 IMPLANT
SPONGE LAP 4X18 X RAY DECT (DISPOSABLE) ×6 IMPLANT
STAPLER VISISTAT 35W (STAPLE) ×3 IMPLANT
STRIP CLOSURE SKIN 1/2X4 (GAUZE/BANDAGES/DRESSINGS) ×2 IMPLANT
SUCTION FRAZIER HANDLE 10FR (MISCELLANEOUS) ×2
SUCTION TUBE FRAZIER 10FR DISP (MISCELLANEOUS) ×1 IMPLANT
SUT MNCRL AB 3-0 PS2 18 (SUTURE) ×3 IMPLANT
SUT VIC AB 1 CT1 27 (SUTURE) ×2
SUT VIC AB 1 CT1 27XBRD ANBCTR (SUTURE) ×1 IMPLANT
SUT VIC AB 2-0 CT1 27 (SUTURE) ×2
SUT VIC AB 2-0 CT1 TAPERPNT 27 (SUTURE) ×1 IMPLANT
SUT VICRYL 0 CT 1 36IN (SUTURE) IMPLANT
SYR CONTROL 10ML LL (SYRINGE) ×3 IMPLANT
TOWEL OR 17X24 6PK STRL BLUE (TOWEL DISPOSABLE) ×3 IMPLANT
TOWEL OR 17X26 10 PK STRL BLUE (TOWEL DISPOSABLE) ×3 IMPLANT
YANKAUER SUCT BULB TIP NO VENT (SUCTIONS) ×3 IMPLANT
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
## 2015-03-25 NOTE — Anesthesia Preprocedure Evaluation (Addendum)
Anesthesia Evaluation  Patient identified by MRN, date of birth, ID band Patient awake    Reviewed: Allergy & Precautions, NPO status , Patient's Chart, lab work & pertinent test results  Airway Mallampati: II  TM Distance: >3 FB Neck ROM: Full    Dental no notable dental hx. (+) Teeth Intact, Dental Advisory Given, Caps   Pulmonary former smoker,    Pulmonary exam normal breath sounds clear to auscultation       Cardiovascular negative cardio ROS Normal cardiovascular exam Rhythm:Regular Rate:Normal     Neuro/Psych negative neurological ROS  negative psych ROS   GI/Hepatic negative GI ROS, Neg liver ROS,   Endo/Other  negative endocrine ROS  Renal/GU negative Renal ROS  negative genitourinary   Musculoskeletal Fx Right Clavicle   Abdominal   Peds  Hematology  (+) anemia ,   Anesthesia Other Findings 2 unit fixed bridge upper left front  Reproductive/Obstetrics                            Anesthesia Physical Anesthesia Plan  ASA: I  Anesthesia Plan: General   Post-op Pain Management: GA combined w/ Regional for post-op pain   Induction: Intravenous  Airway Management Planned: Oral ETT  Additional Equipment:   Intra-op Plan:   Post-operative Plan: Extubation in OR  Informed Consent: I have reviewed the patients History and Physical, chart, labs and discussed the procedure including the risks, benefits and alternatives for the proposed anesthesia with the patient or authorized representative who has indicated his/her understanding and acceptance.   Dental advisory given  Plan Discussed with: Anesthesiologist, Surgeon and CRNA  Anesthesia Plan Comments:         Anesthesia Quick Evaluation

## 2015-03-25 NOTE — OR Nursing (Addendum)
Pt was discharged home with wife. Pt stated that his pain was a 5/10 and refused to have any pain medication. Ice was applied to site upon arrival to PACU, when pt woke up he removed the ice and refused to have ice placed back on. Pt also did not want to take TED hose home with him. Discharge instructions were given to pt and his wife. All of patients questions were answered and denied any further questions prior to discharge. There was an arm sling that arrived with pt in PACU that was sent home with patient.

## 2015-03-25 NOTE — Transfer of Care (Signed)
Immediate Anesthesia Transfer of Care Note  Patient: Randall Villarreal  Procedure(s) Performed: Procedure(s): OPEN REDUCTION INTERNAL FIXATION (ORIF) RIGHT  CLAVICULAR FRACTURE (Right)  Patient Location: PACU  Anesthesia Type:General and Regional  Level of Consciousness: awake, alert  and patient cooperative  Airway & Oxygen Therapy: Patient Spontanous Breathing and Patient connected to nasal cannula oxygen  Post-op Assessment: Report given to RN, Post -op Vital signs reviewed and stable and Patient moving all extremities  Post vital signs: Reviewed and stable  Last Vitals:  Filed Vitals:   03/25/15 1339 03/25/15 1340  BP: 136/67   Pulse: 73 66  Temp:    Resp: 16 14    Complications: No apparent anesthesia complications

## 2015-03-25 NOTE — Anesthesia Postprocedure Evaluation (Signed)
Anesthesia Post Note  Patient: Randall Villarreal  Procedure(s) Performed: Procedure(s) (LRB): OPEN REDUCTION INTERNAL FIXATION (ORIF) RIGHT  CLAVICULAR FRACTURE (Right)  Patient location during evaluation: PACU Anesthesia Type: General and Regional Level of consciousness: awake and alert Pain management: satisfactory to patient Vital Signs Assessment: post-procedure vital signs reviewed and stable Respiratory status: spontaneous breathing, nonlabored ventilation, respiratory function stable and patient connected to nasal cannula oxygen Cardiovascular status: blood pressure returned to baseline and stable Postop Assessment: no signs of nausea or vomiting Anesthetic complications: no    Last Vitals:  Filed Vitals:   03/25/15 1620 03/25/15 1713  BP: 127/94 133/92  Pulse:  57  Temp: 36.6 C 36.6 C  Resp: 16 16    Last Pain:  Filed Vitals:   03/25/15 1729  PainSc: 5                  Cecile Hearing

## 2015-03-25 NOTE — Op Note (Signed)
03/25/2015  3:56 PM  PATIENT:   Randall Villarreal  50 y.o. male  PRE-OPERATIVE DIAGNOSIS:  DISPLACED RIGHT CLAVICLE FRACTURE  POST-OPERATIVE DIAGNOSIS:  SAME  PROCEDURE:  ORIF RIGHT CLAVICLE FRACTURE  SURGEON:  Caleah Tortorelli, Vania Rea. M.D.  ASSISTANTS: Shuford pac   ANESTHESIA:   GET + ISB  EBL: 100  SPECIMEN:  none  Drains: none   PATIENT DISPOSITION:  PACU - hemodynamically stable.    PLAN OF CARE: Discharge to home after PACU  Dictation# [id number]   Contact # [phone]

## 2015-03-25 NOTE — Discharge Instructions (Signed)
Ok to allow arm to dangle and to move it for hygiene. Sling on but can remove to allow arm to dangle and move elbow wrist and hand. Leave current dressing on until day 3, then can remove to perform dressing changes  You may shower on day 3 and allow water to run across incision, then pat it dry and perform dressing change if needed Call [phone] for any questions or concerns

## 2015-03-25 NOTE — Anesthesia Procedure Notes (Addendum)
Anesthesia Regional Block:  Interscalene brachial plexus block  Pre-Anesthetic Checklist: ,, timeout performed, Correct Patient, Correct Site, Correct Laterality, Correct Procedure, Correct Position, site marked, Risks and benefits discussed,  Surgical consent,  Pre-op evaluation,  At surgeon's request and post-op pain management  Laterality: Right  Prep: chloraprep       Needles:  Injection technique: Single-shot  Needle Type: Echogenic Stimulator Needle     Needle Length: 9cm 9 cm Needle Gauge: 21 and 21 G  Needle insertion depth: 5 cm   Additional Needles:  Procedures: ultrasound guided (picture in chart) Interscalene brachial plexus block Narrative:  Start time: 03/25/2015 1:00 PM Injection made incrementally with aspirations every 5 mL.  Performed by: Personally  Anesthesiologist: Mal Amabile  Additional Notes: Patient tolerated procedure well.    Procedure Name: Intubation Date/Time: 03/25/2015 2:26 PM Performed by: Darcey Nora B Pre-anesthesia Checklist: Patient identified, Emergency Drugs available, Suction available and Patient being monitored Patient Re-evaluated:Patient Re-evaluated prior to inductionOxygen Delivery Method: Circle system utilized Preoxygenation: Pre-oxygenation with 100% oxygen Intubation Type: IV induction Ventilation: Mask ventilation without difficulty Laryngoscope Size: Mac and 3 Grade View: Grade I Tube type: Oral Tube size: 8.0 mm Number of attempts: 1 Airway Equipment and Method: Stylet Secured at: 23 (cm at teeth) cm Tube secured with: Tape Dental Injury: Teeth and Oropharynx as per pre-operative assessment

## 2015-03-26 ENCOUNTER — Encounter (HOSPITAL_COMMUNITY): Payer: Self-pay | Admitting: Orthopedic Surgery

## 2015-03-26 NOTE — Op Note (Signed)
Randall Villarreal, Randall Villarreal NO.:  [id number]  MEDICAL RECORD NO.:  [id number]  LOCATION:  [facility]                         FACILITY:  [facility]  PHYSICIAN:  Vania Rea. Victora Irby, M.D.  DATE OF BIRTH:  [birth year]  DATE OF PROCEDURE:  03/25/2015 DATE OF DISCHARGE:  03/25/2015                              OPERATIVE REPORT   PREOPERATIVE DIAGNOSIS:  Severely displaced and foreshortened right midshaft clavicle fracture.  POSTOPERATIVE DIAGNOSIS:  Severely displaced and foreshortened right midshaft clavicle fracture.  PROCEDURE:  An open reduction and internal fixation of displaced right midshaft clavicle fracture.  SURGEON:  Vania Rea. Zalaya Astarita, M.D.  Threasa HeadsFrench Ana A. Shuford, P.A.-C.  ANESTHESIA:  General endotracheal as well as interscalene block.  ESTIMATED BLOOD LOSS:  100 mL.  DRAINS:  None.  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  Initial radiographs at an outside facility confirmed significant displacement and shortening of a midshaft clavicle fracture with a long oblique pattern.  Subsequent radiographs in our office yesterday confirmed a marked shortening and displacement of the clavicle fracture on the right.  He is now brought to the operating room for planned open reduction and internal fixation.  Preoperatively, I counseled Randall Villarreal regarding treatment options and potential risks versus benefits thereof.  Possible surgical complications were all reviewed including bleeding, infection, neurovascular injury, malunion, nonunion, loss of fixation, anesthetic complication, and possible need for additional surgery.  He understands and accepts and agrees with the plan.  PROCEDURE IN DETAIL:  After undergoing routine preop evaluation, the patient did receive prophylactic antibiotics and had an interscalene block established by the Anesthesia Department in the holding  area. Brought to the operating room.  Placed supine on the operating table. Underwent smooth induction of a general endotracheal anesthesia.  Placed into beach-chair position and appropriately padded and protected.  The right shoulder girdle region was then sterilely prepped and draped in standard fashion.  Time-out was called.  A transverse 8 cm incision was made just below the subcutaneous border of the right clavicle centered about the fracture site.  Skin flaps were elevated.  Dissection carried deeply.  The clavipectoral fascia was then divided, exposing the clavicle about the fracture site both medially and laterally.  The fracture pattern was very long, oblique, which had a superior/inferior plane of fracture and this pattern was felt to be most amenable to the use of an anterior plate.  We removed soft tissue from the fracture site, irrigated, obtained a provisional reduction and then selected the appropriate anterior plate from the clavicle plate tray and I did go ahead and perform some additional bending such that the contours were appropriate.  We then held the provisional fixation with a bone clamp and placed our plate anteriorly and placed 2 lag screws through the plate traversing across this long oblique fracture line, which obtained a provisional fixation with excellent bony purchase.  We then completed the fixation using a series of nonlocking screws through the oblong holes and locking screws through the appropriate locking holes and the overall construct obtained excellent bony purchase.  Good compression of the fracture site.  Good position of the hardware and anatomic alignment at the fracture site.  We used fluoroscopic imaging to confirm the overall position and alignment, was to our satisfaction.  At this point, we then copiously irrigated the incision.  Hemostasis was obtained.  The deep fascial layers and periosteal layers were closed with  interrupted figure-of-eight and #1 Vicryl sutures.  2-0 Vicryl used for subcu layer, intracuticular 3-0 Monocryl for the skin, followed by Dermabond and a Mepilex dressing.  Right arm was then placed in a sling.  The patient was awakened, extubated, and taken to recovery room in stable condition.  Tracy A. Shuford, PA-C, was used as an Geophysicist/field seismologist throughout this case, essential for help with positioning of the patient, positioning the extremity, tissue retraction, manipulation of the fracture site, wound closure, and intraoperative decision making.     Vania Rea. Matia Zelada, M.D.     KMS/MEDQ  D:  03/25/2015  T:  03/25/2015  Job:  [id number]

## 2016-05-16 IMAGING — RF DG CLAVICLE*L*
1 series · 1 of 1 positions shown · non-contrast
Comparison: None.

CLINICAL DATA: LEFT clavicle fracture.

EXAM:
DG C-ARM 61-120 MIN; LEFT CLAVICLE - 2+ VIEWS

[Series 1: run · 1 of 1 slices shown]
[im 1/1]
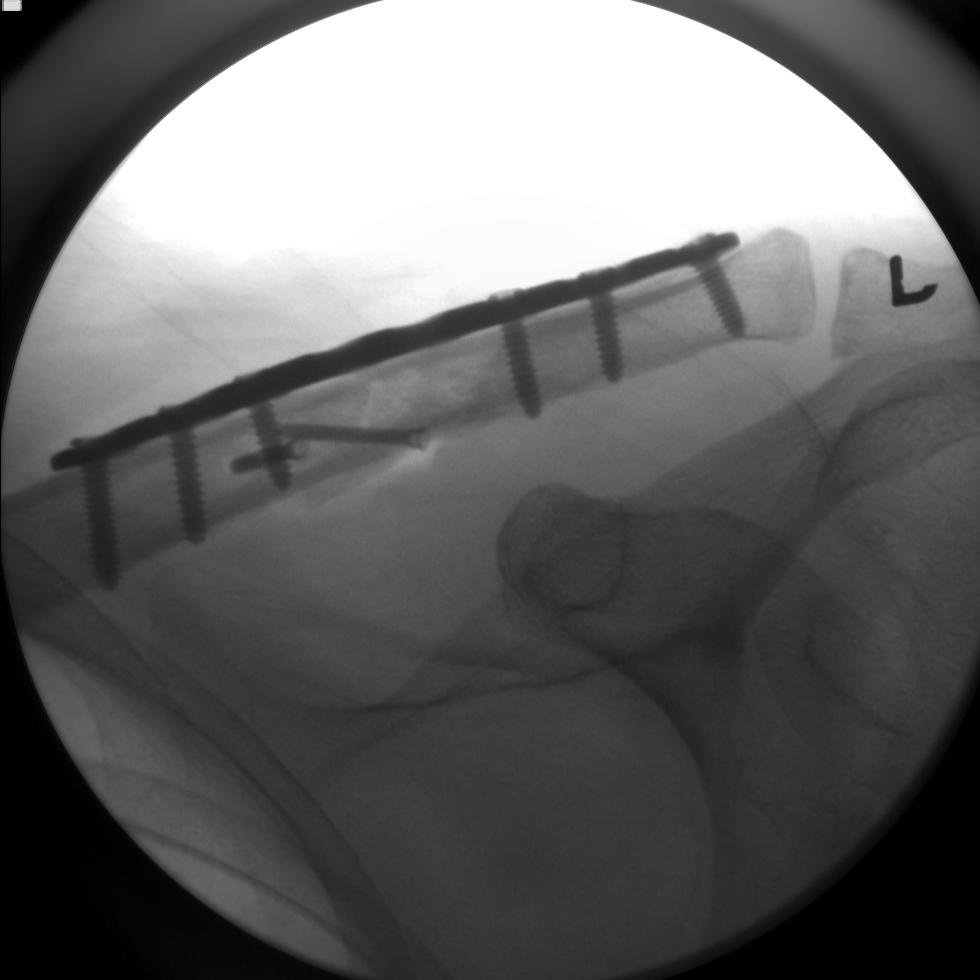

[1 of 1 positions shown; findings below may reference images not displayed]

FINDINGS: Single AP fluoroscopic spot view of the LEFT clavicle shows plate
and screw fixation with interfragmentary screws.
IMPRESSION: LEFT clavicle ORIF.

## 2017-01-21 IMAGING — RF DG CLAVICLE*R*
1 series · 2 of 2 positions shown · non-contrast
Comparison: None.

CLINICAL DATA: Operative reduction and internal fixation of right
clavicular fracture.

EXAM:
RIGHT CLAVICLE - 2+ VIEWS; DG C-ARM 61-120 MIN

[Series 1: run · 2 of 2 slices shown]
[im 1/2]
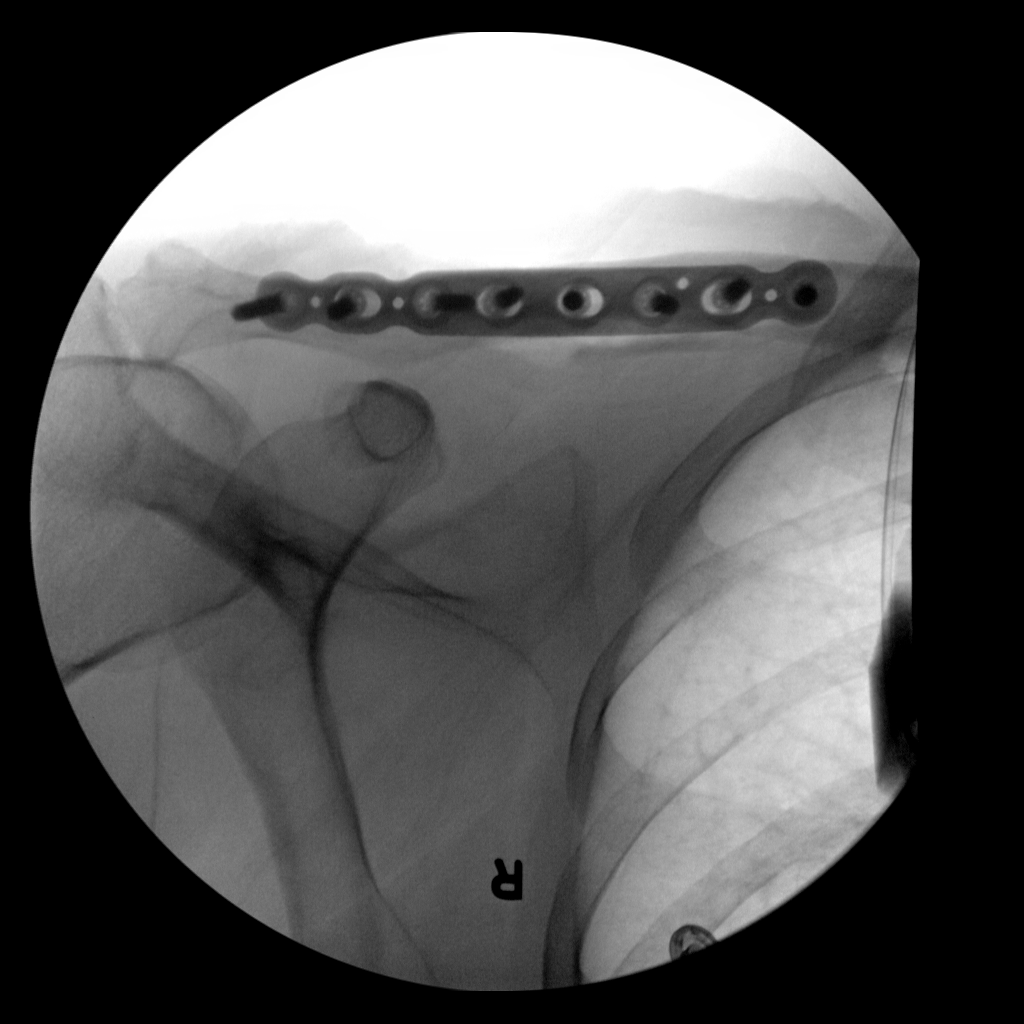
[im 2/2]
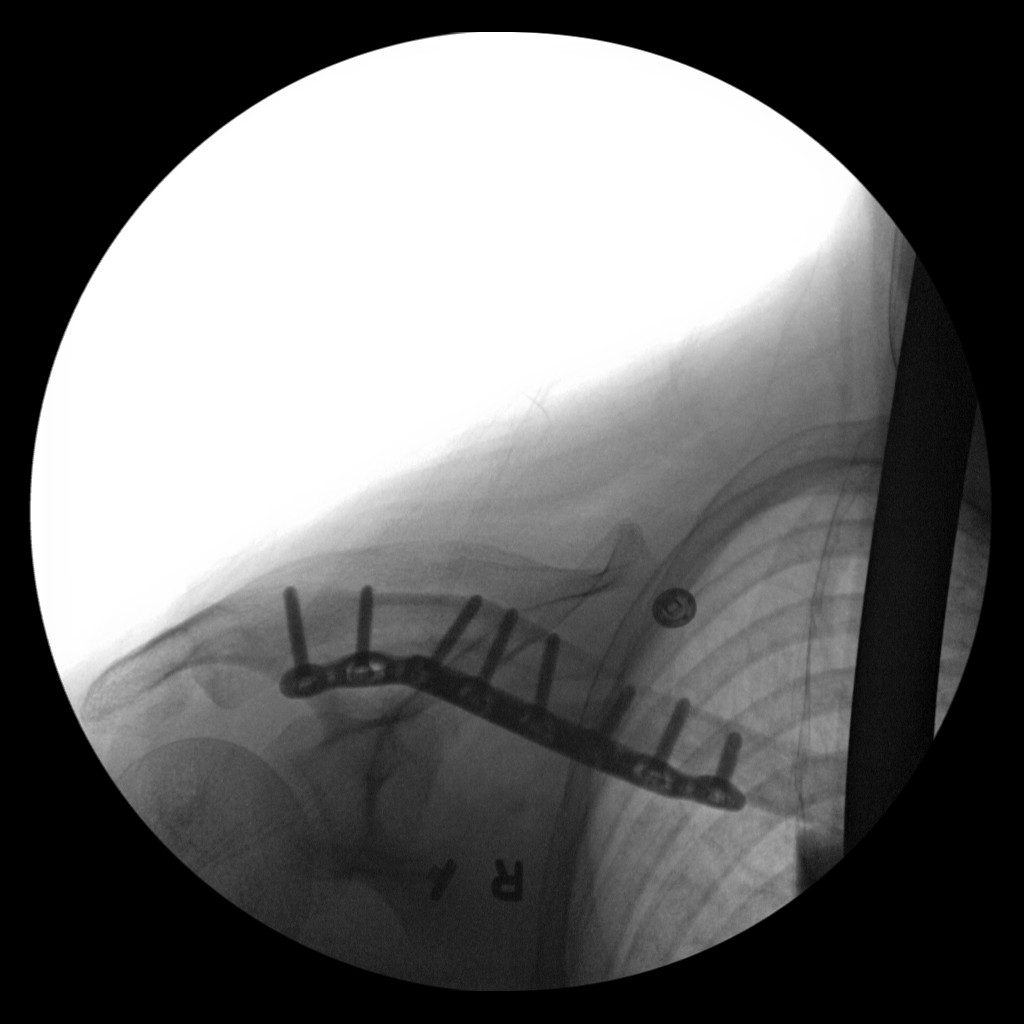

[2 of 2 positions shown; findings below may reference images not displayed]

FINDINGS: Two images the right clavicle obtained at different angulations
demonstrate a plate and screw fixator of the clavicle in good
position. The regional fracture sites are not readily apparent ; in
the vicinity of the plate and screw fixator, clavicular alignment is
near-anatomic.
IMPRESSION: 1. Clavicular plate screw fixator, with near-anatomic alignment of
the clavicle along the plate and screw fixator. Presumed original
fracture plane(s) indistinct.
